# Patient Record
Sex: Male | Born: 1955 | Race: White | Hispanic: No | Marital: Married
Health system: Southern US, Community
[De-identification: ages and names within clinical notes are randomized; demographics above are authoritative.]

---

## 2010-04-27 ENCOUNTER — Encounter (INDEPENDENT_AMBULATORY_CARE_PROVIDER_SITE_OTHER): Payer: Self-pay | Admitting: General Surgery

## 2010-04-27 ENCOUNTER — Inpatient Hospital Stay (HOSPITAL_COMMUNITY): Admission: EM | Admit: 2010-04-27 | Discharge: 2010-04-28 | Payer: Self-pay | Admitting: Emergency Medicine

## 2011-01-16 ENCOUNTER — Encounter: Payer: Self-pay | Admitting: Family Medicine

## 2011-03-15 LAB — CBC
HCT: 44 % (ref 39.0–52.0)
Hemoglobin: 14.7 g/dL (ref 13.0–17.0)
MCHC: 33.4 g/dL (ref 30.0–36.0)
RDW: 12.7 % (ref 11.5–15.5)
WBC: 8.9 10*3/uL (ref 4.0–10.5)

## 2011-03-15 LAB — DIFFERENTIAL
Basophils Absolute: 0 10*3/uL (ref 0.0–0.1)
Eosinophils Relative: 0 % (ref 0–5)
Lymphocytes Relative: 8 % — ABNORMAL LOW (ref 12–46)
Monocytes Relative: 3 % (ref 3–12)

## 2011-03-15 LAB — COMPREHENSIVE METABOLIC PANEL
ALT: 21 U/L (ref 0–53)
CO2: 24 mEq/L (ref 19–32)
Calcium: 9.4 mg/dL (ref 8.4–10.5)
GFR calc Af Amer: >60 mL/min (ref >60)
Glucose, Bld: 188 mg/dL — ABNORMAL HIGH (ref 70–99)
Potassium: 4.6 mEq/L (ref 3.5–5.1)
Sodium: 140 mEq/L (ref 135–145)
Total Bilirubin: 0.6 mg/dL (ref 0.3–1.2)
Total Protein: 7.1 g/dL (ref 6.0–8.3)

## 2011-03-15 LAB — LIPASE, BLOOD: Lipase: 30 U/L (ref 11–59)

## 2011-04-15 IMAGING — CR DG CHEST 2V
2 series · 2 of 2 positions shown · non-contrast
Comparison: None.

CLINICAL DATA: Abdominal pain.  Pre laparoscopic cholecystectomy
evaluation.  Smoker.

CHEST - 2 VIEW

[w chest lat]
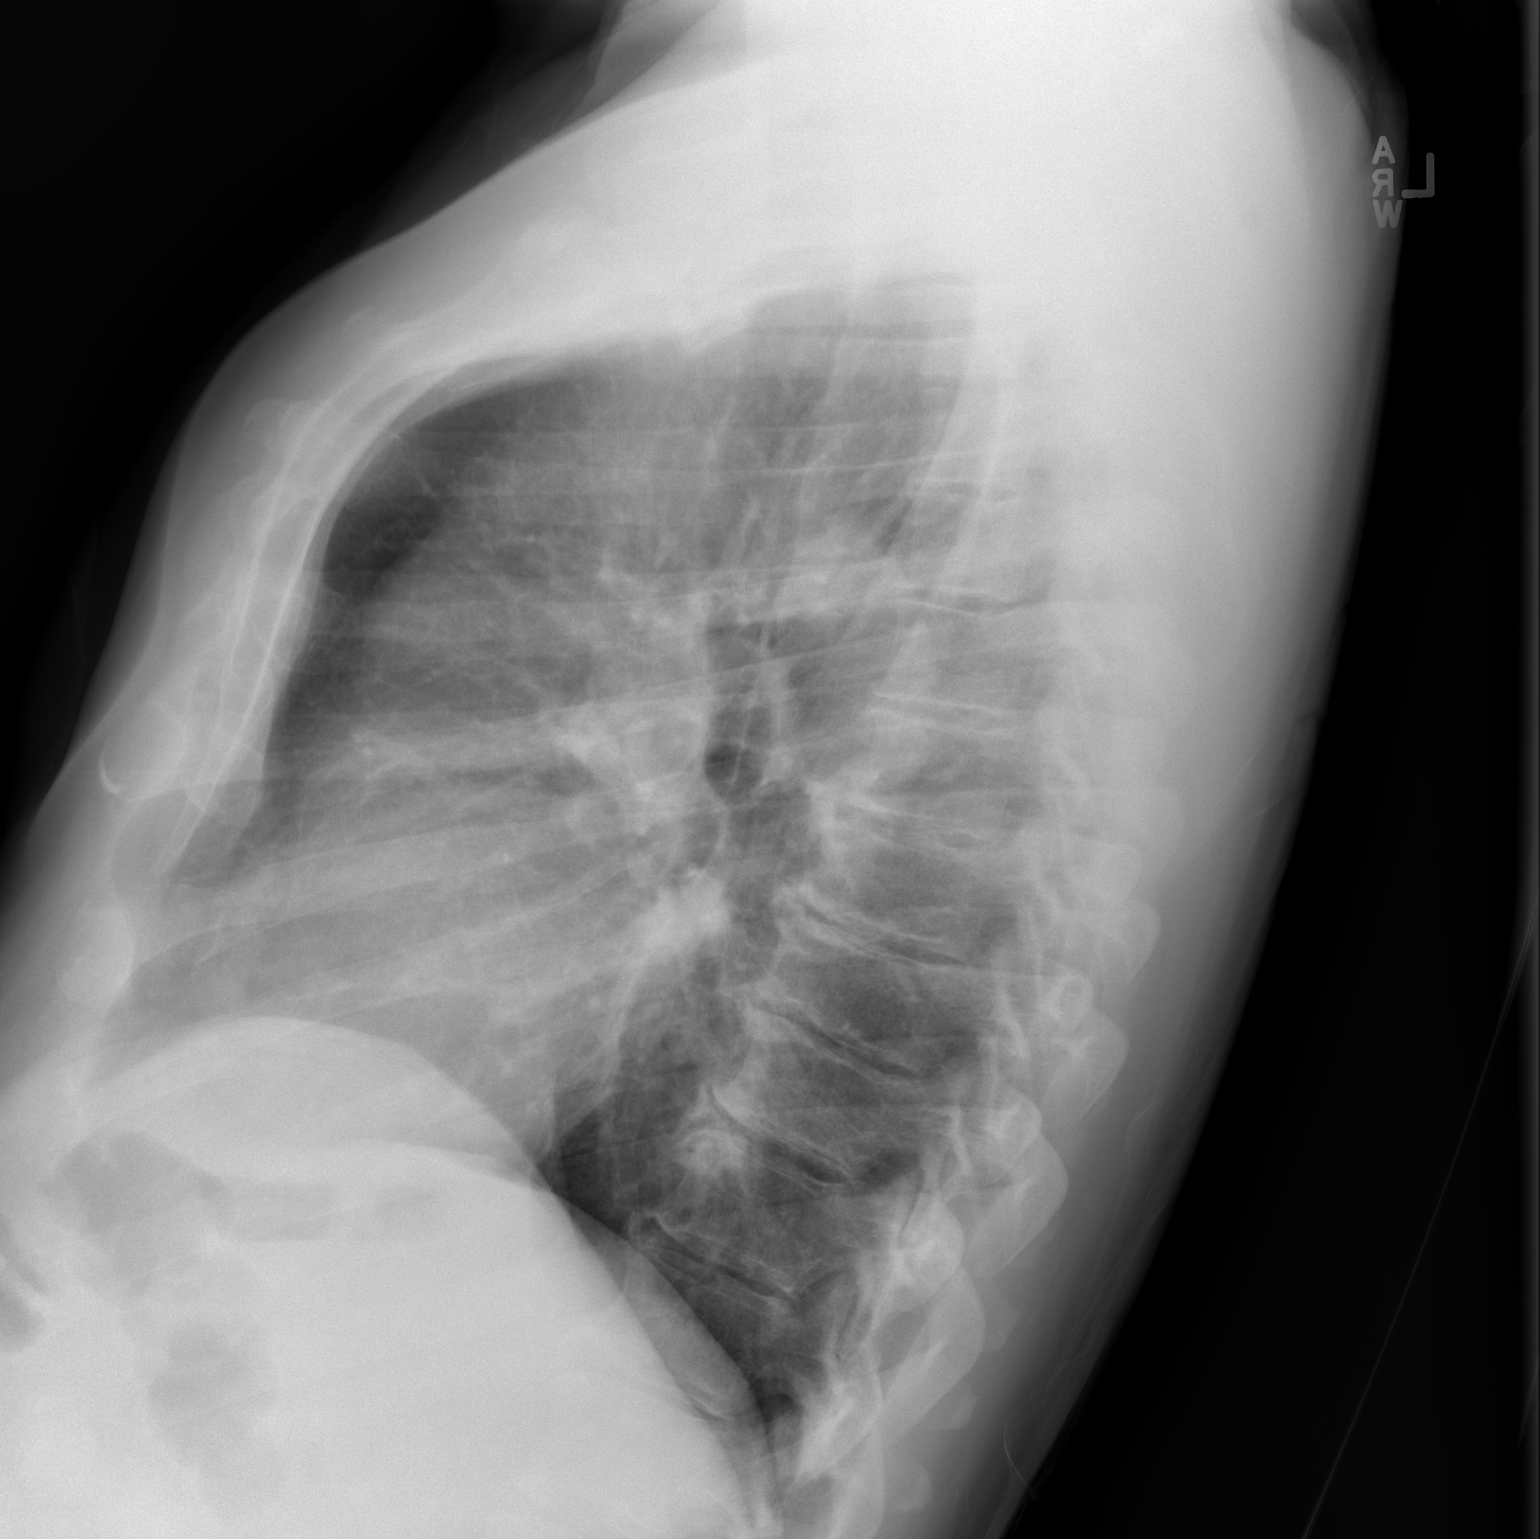

[view not recorded]
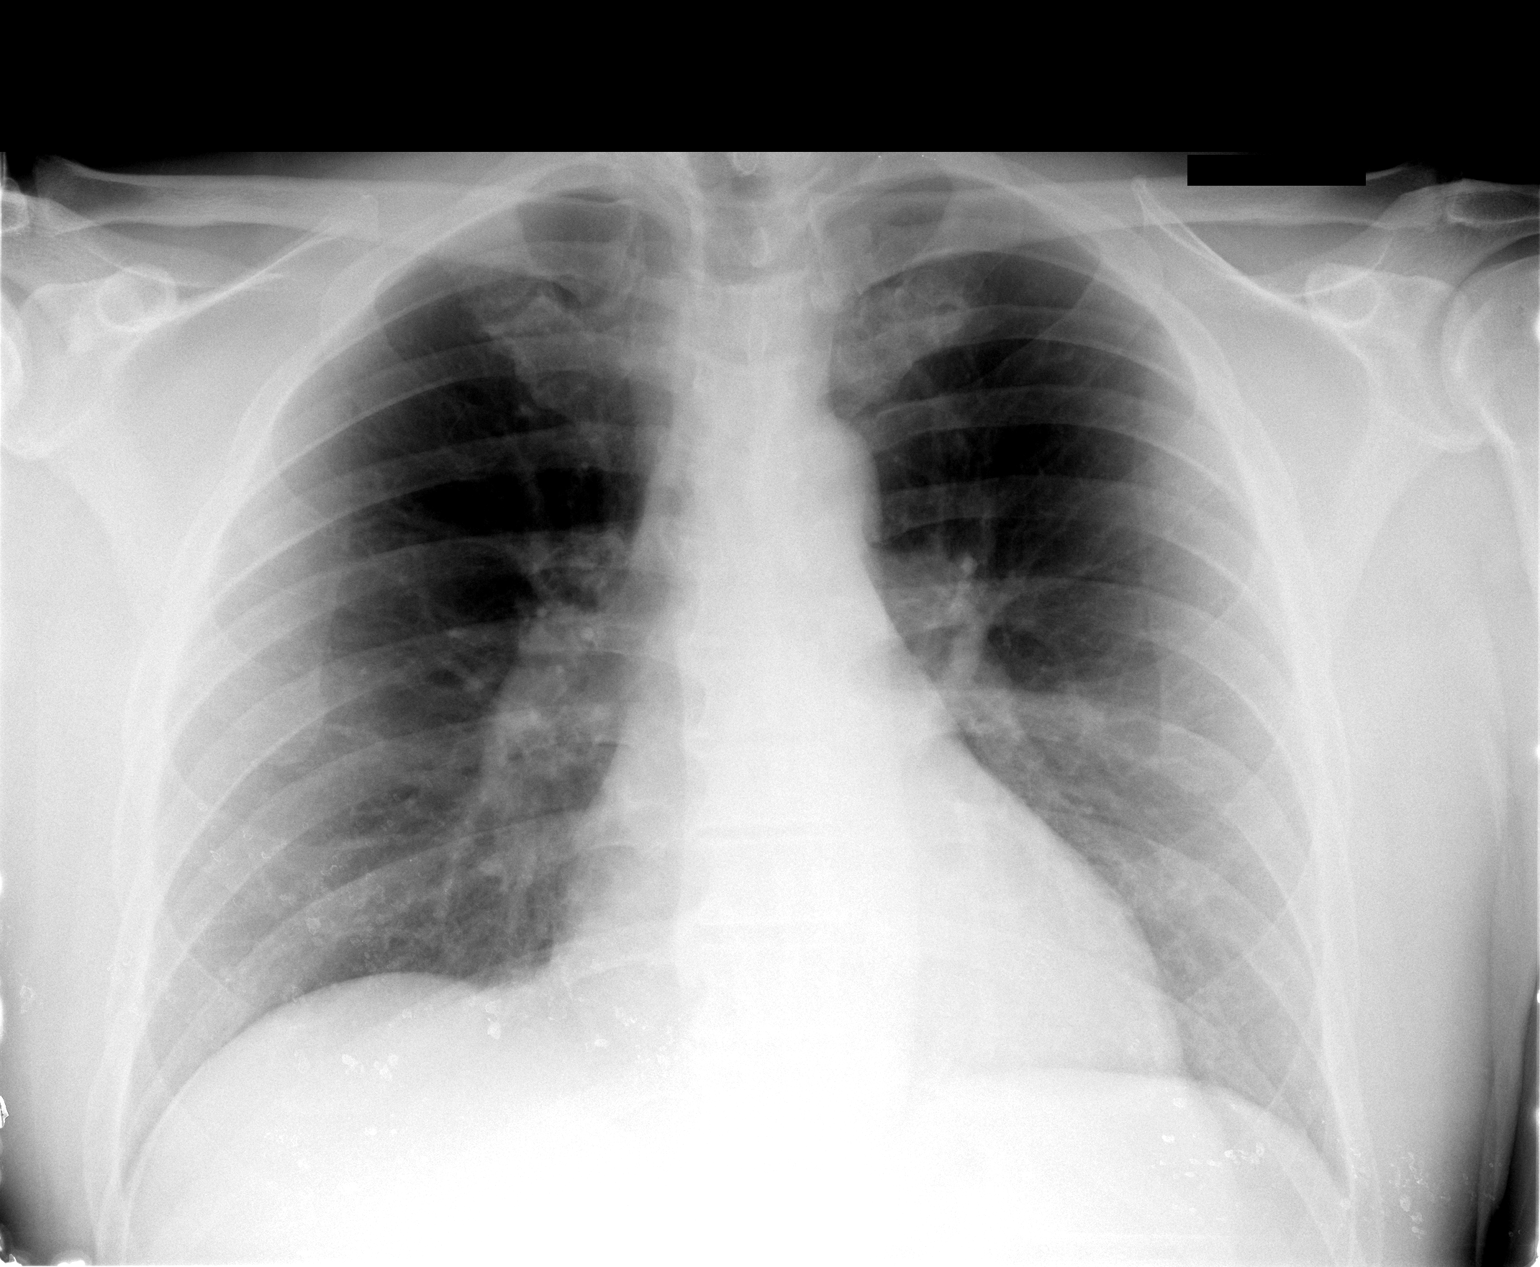

[2 of 2 positions shown; findings below may reference images not displayed]

FINDINGS: Borderline enlarged cardiac silhouette.  Mild prominence
of the central pulmonary vasculature.  Clear lungs.  Pectus
carinatum.  Thoracic spine degenerative changes.  Image artifacts
on the frontal view.
IMPRESSION: Borderline cardiomegaly and mild pulmonary vascular congestion.

## 2011-04-15 IMAGING — RF DG CHOLANGIOGRAM OPERATIVE
1 series · 4 of 4 positions shown · non-contrast
Comparison: Ultrasound obtained earlier today.

CLINICAL DATA: Laparoscopic cholecystectomy for cholelithiasis.

INTRAOPERATIVE CHOLANGIOGRAM
TECHNIQUE: Multiple fluoroscopic spot radiographs were obtained
during intraoperative cholangiogram and are submitted for
interpretation post-operatively.

[Series 1: run · 4 of 62 frames shown]
[frame 10/62]
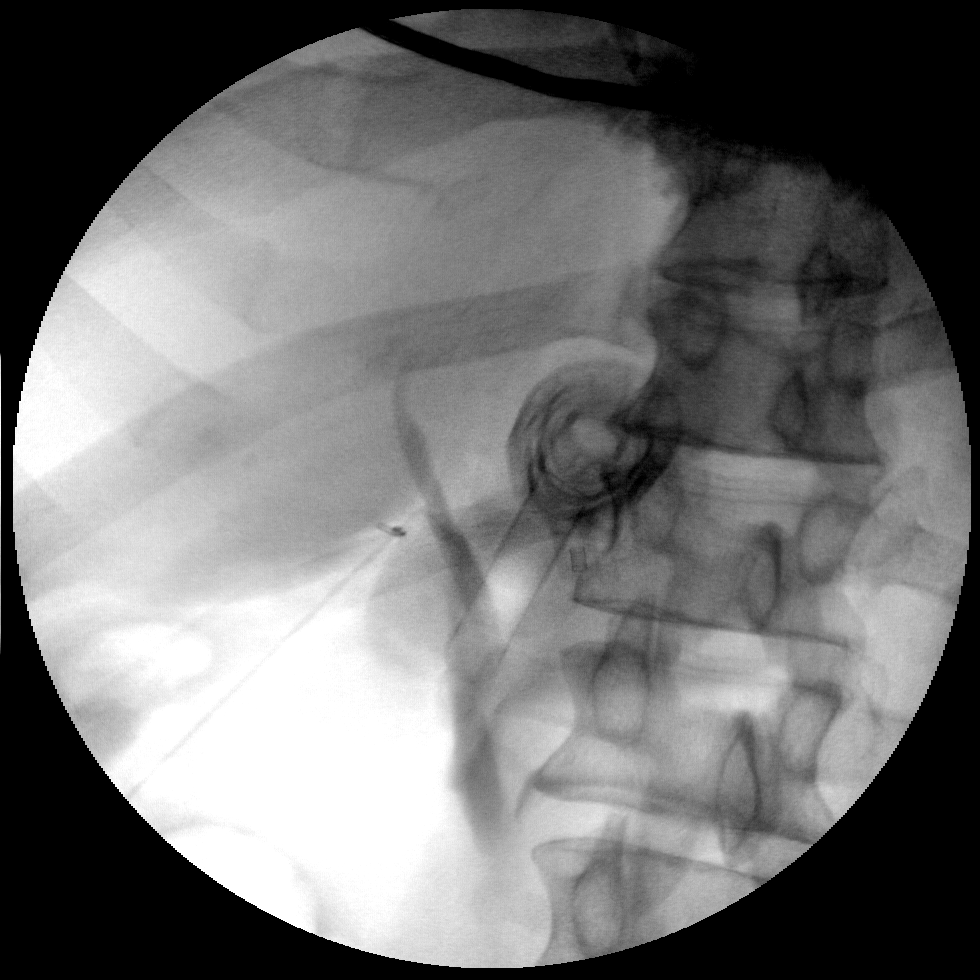
[frame 32/62]
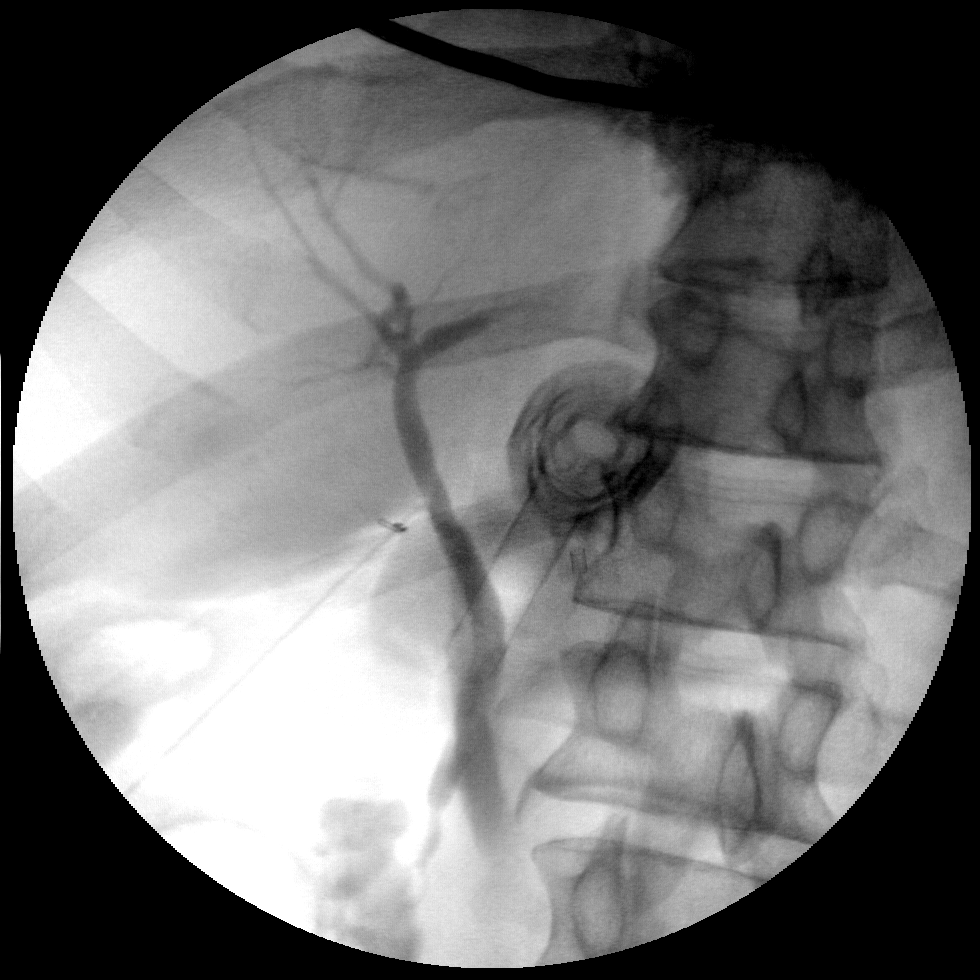
[frame 53/62]
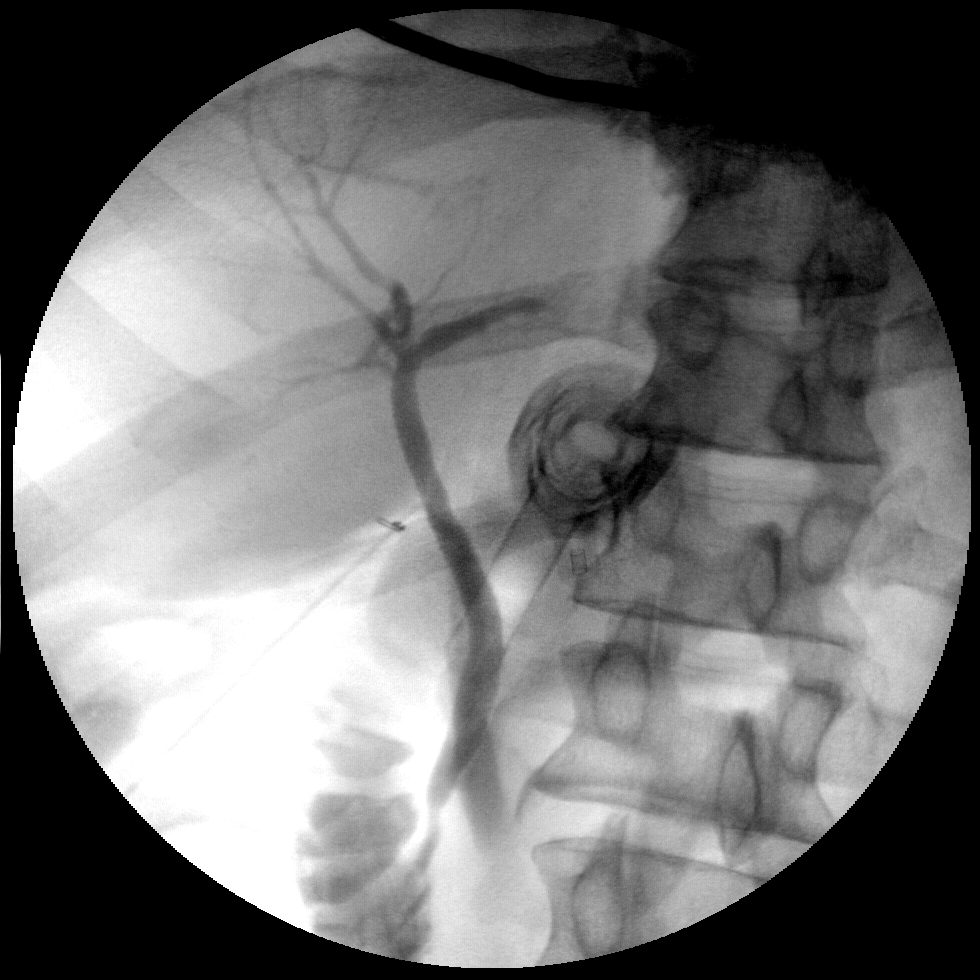
[frame 55/62]
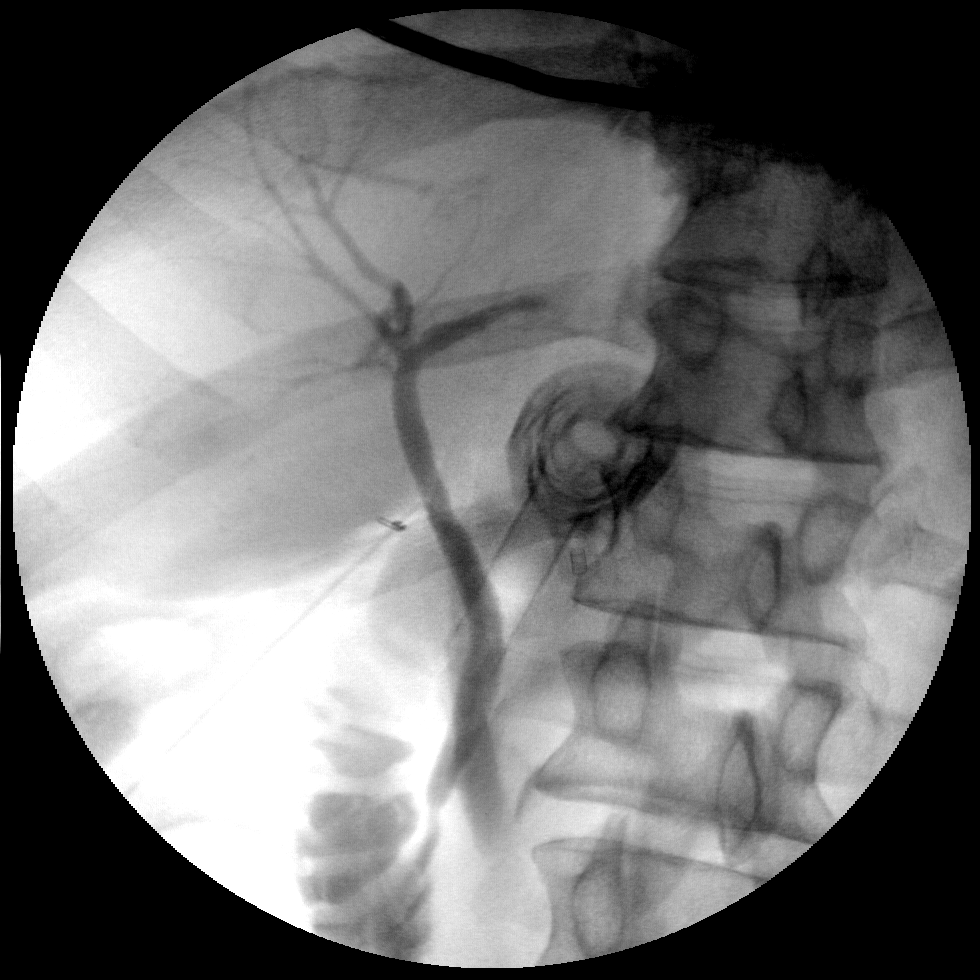

[4 of 4 positions shown; findings below may reference images not displayed]

FINDINGS: Normally opacified common duct and intrahepatic ducts
with free flow of contrast into the duodenum.  No ductal dilatation
or filling defects.
IMPRESSION: Normal examination.

These images were submitted for radiologic interpretation only.
Please see the procedural report for the amount of contrast and the
fluoroscopy time utilized.

## 2011-04-15 IMAGING — US US ABDOMEN COMPLETE
1 series · 14 of 25 positions shown · non-contrast
Comparison: None.

CLINICAL DATA: Abdominal pain.

COMPLETE ABDOMINAL ULTRASOUND

[Series 1: us abdomen complete · 0.30mm/px · 14 of 103 slices shown]
[im 1/103]
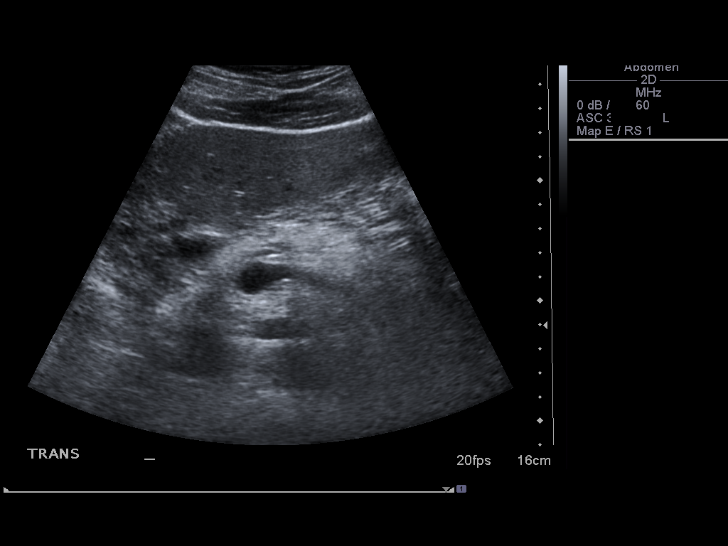
[im 9/103]
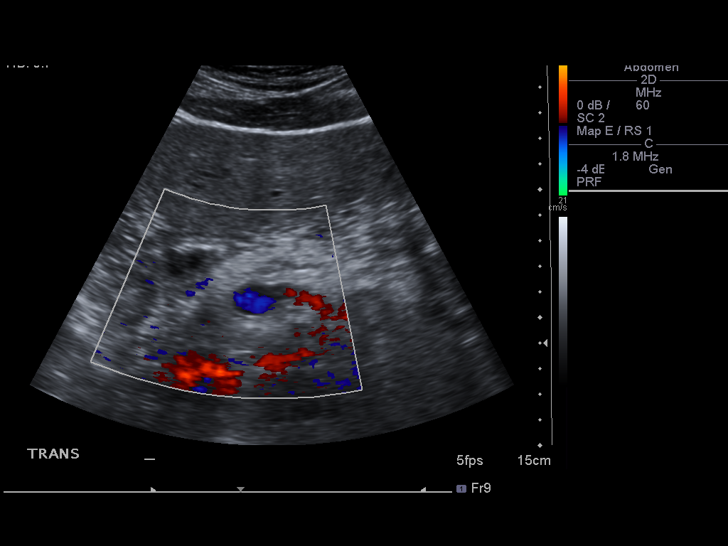
[im 18/103]
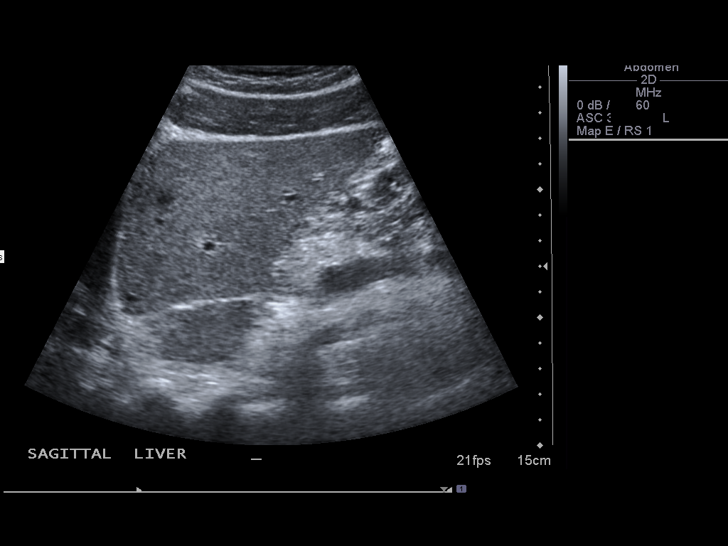
[im 26/103]
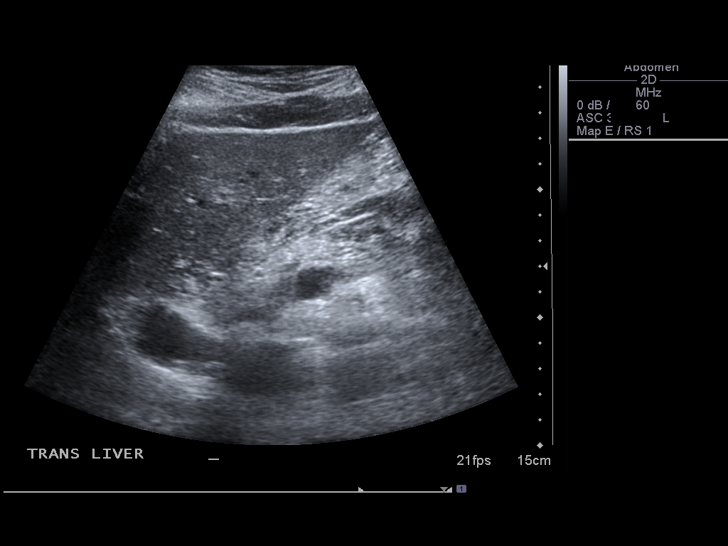
[im 35/103]
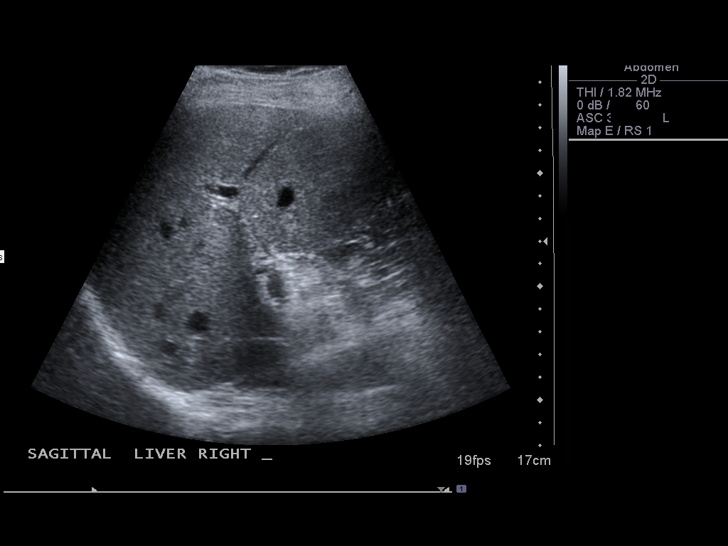
[im 39/103]
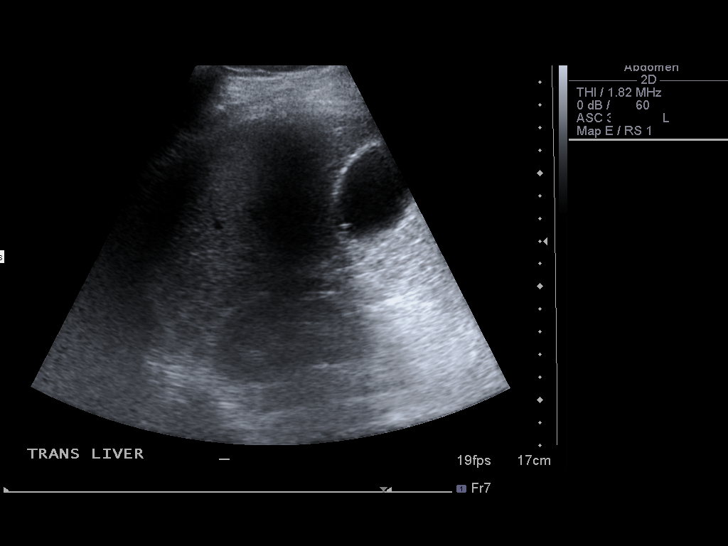
[im 47/103]
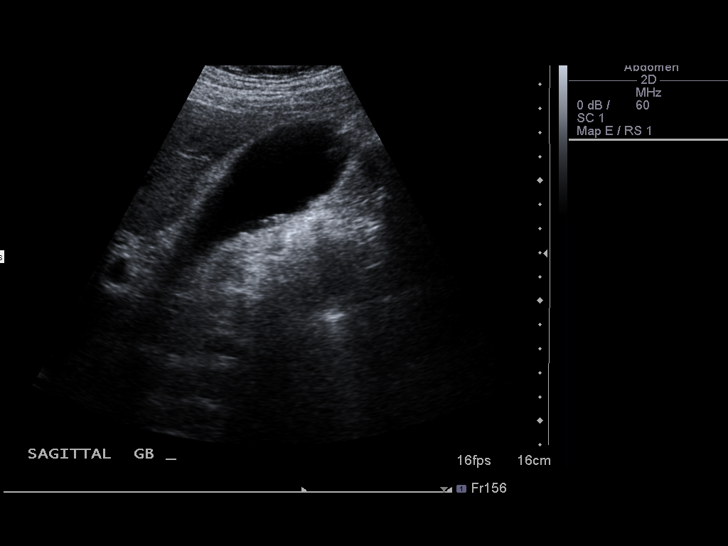
[im 56/103]
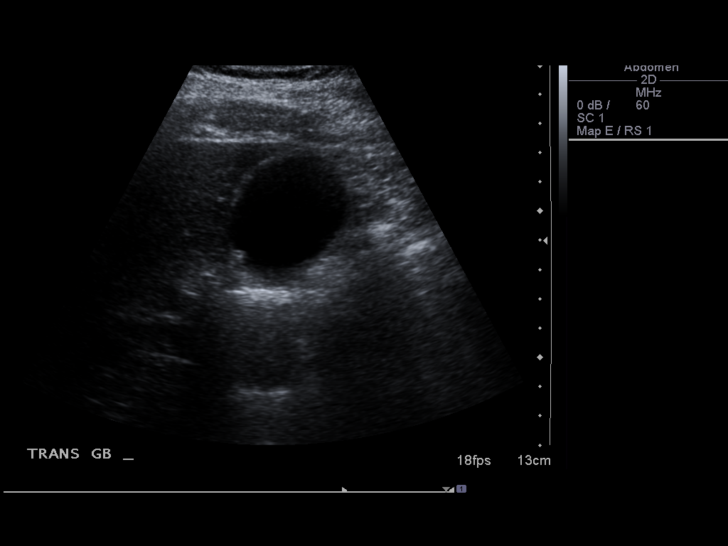
[im 64/103]
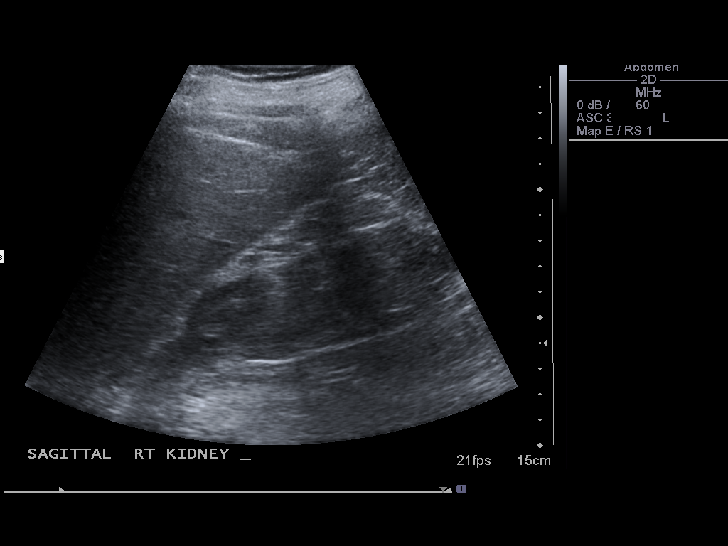
[im 69/103]
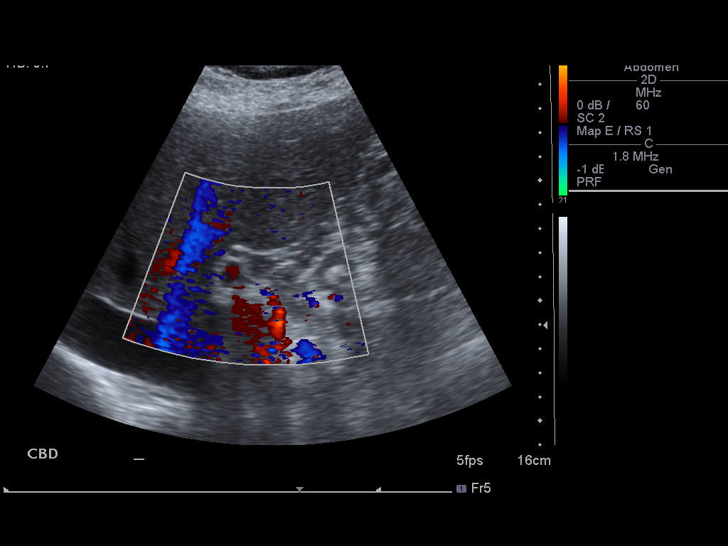
[im 77/103]
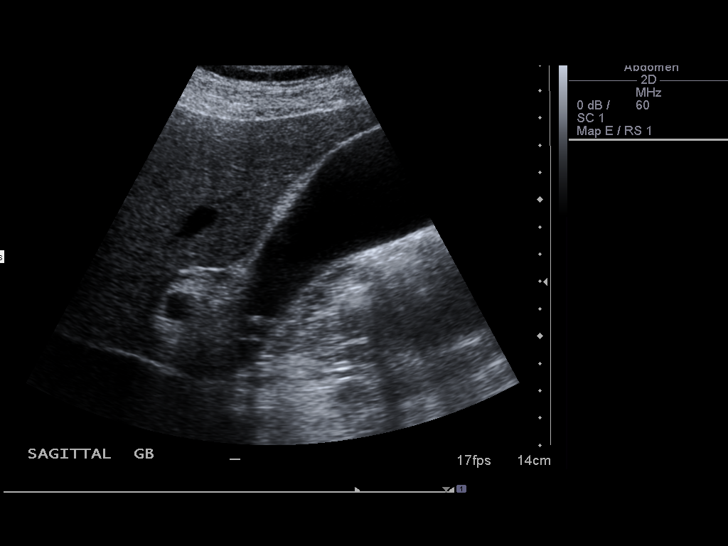
[im 86/103]
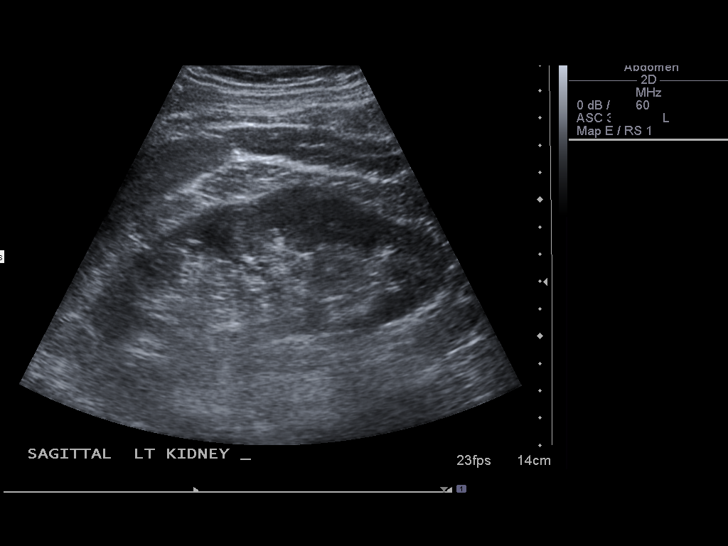
[im 94/103]
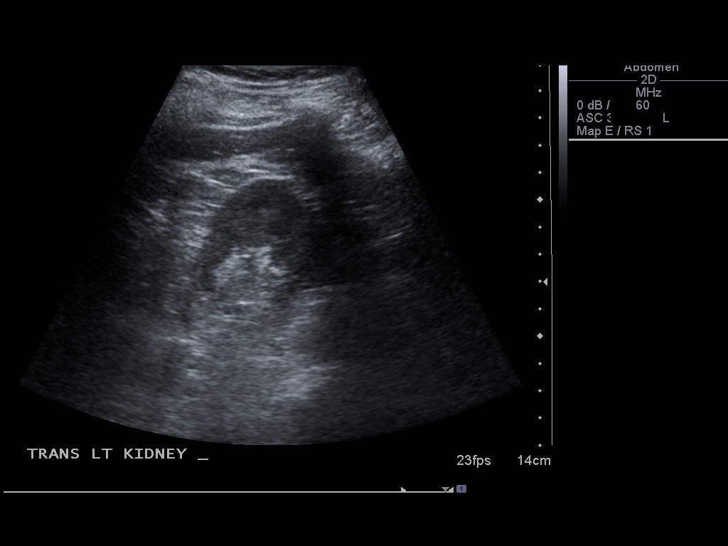
[im 103/103]
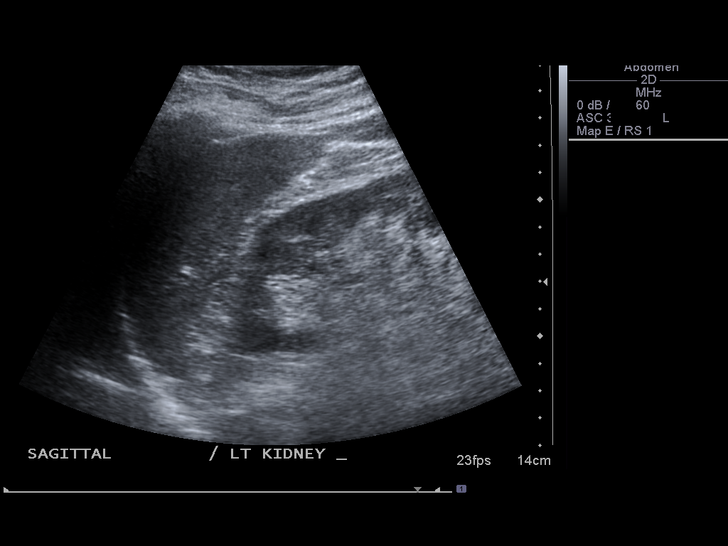

[14 of 25 positions shown; findings below may reference images not displayed]

FINDINGS: Gallbladder:  Multiple small, mobile gallstones in the gallbladder.
The largest stone measures 6 mm in maximum diameter.  The patient
was focally tender over the gallbladder despite pain medication.
No gallbladder wall thickening or pericholecystic fluid.

Common bile duct:  Normal, measuring 3.6 mm in diameter proximally.

Liver:  Normal.

IVC:  Normal.

Pancreas:  Minimally dilated pancreatic duct, measuring 2.5 mm in
maximum diameter.  Otherwise, normal appearing pancreas with no
visible mass.

Spleen:  Normal, measuring 9.0 cm in length.

Right Kidney:  Normal, measuring 13.7 cm in length.

Left Kidney:  Normal, measuring 14.0 cm in length.

Abdominal aorta:  Normal in caliber, without aneurysm.

No free peritoneal fluid seen.
IMPRESSION: 1.  Cholelithiasis and focal tenderness over the gallbladder.  No
other findings to indicate acute cholecystitis.
2.  Minimally dilated pancreatic duct, of doubtful clinical
significance.

## 2020-11-26 ENCOUNTER — Telehealth (HOSPITAL_COMMUNITY): Payer: Self-pay | Admitting: Family

## 2020-11-26 DIAGNOSIS — R69 Illness, unspecified: Secondary | ICD-10-CM

## 2020-11-26 NOTE — Telephone Encounter (Signed)
Called to discuss with Leonarda Salon about Covid symptoms and potential candidacy for the use of sotrovimab, a combination monoclonal antibody infusion for those with mild to moderate Covid symptoms and at a high risk of hospitalization.     Pt is qualified for this infusion at the infusion center due to co-morbid conditions and/or a member of an at-risk group, however unable to reach patient. VM left at the number provided in the spreadsheet. Patient was hotline referral.   Ronalda Walpole,NP

## 2022-12-07 DIAGNOSIS — R7303 Prediabetes: Secondary | ICD-10-CM | POA: Diagnosis not present

## 2022-12-07 DIAGNOSIS — Z0000 Encounter for general adult medical examination without abnormal findings: Secondary | ICD-10-CM | POA: Diagnosis not present

## 2022-12-07 DIAGNOSIS — Z13220 Encounter for screening for lipoid disorders: Secondary | ICD-10-CM | POA: Diagnosis not present

## 2022-12-07 DIAGNOSIS — Z125 Encounter for screening for malignant neoplasm of prostate: Secondary | ICD-10-CM | POA: Diagnosis not present

## 2022-12-12 DIAGNOSIS — Z0000 Encounter for general adult medical examination without abnormal findings: Secondary | ICD-10-CM | POA: Diagnosis not present

## 2022-12-12 DIAGNOSIS — R7303 Prediabetes: Secondary | ICD-10-CM | POA: Diagnosis not present

## 2022-12-12 DIAGNOSIS — M109 Gout, unspecified: Secondary | ICD-10-CM | POA: Diagnosis not present

## 2023-01-17 DIAGNOSIS — H2513 Age-related nuclear cataract, bilateral: Secondary | ICD-10-CM | POA: Diagnosis not present

## 2023-01-17 DIAGNOSIS — H40053 Ocular hypertension, bilateral: Secondary | ICD-10-CM | POA: Diagnosis not present

## 2023-01-17 DIAGNOSIS — H43392 Other vitreous opacities, left eye: Secondary | ICD-10-CM | POA: Diagnosis not present

## 2023-07-24 DIAGNOSIS — H40053 Ocular hypertension, bilateral: Secondary | ICD-10-CM | POA: Diagnosis not present

## 2023-08-21 DIAGNOSIS — L814 Other melanin hyperpigmentation: Secondary | ICD-10-CM | POA: Diagnosis not present

## 2023-08-21 DIAGNOSIS — C44612 Basal cell carcinoma of skin of right upper limb, including shoulder: Secondary | ICD-10-CM | POA: Diagnosis not present

## 2023-08-21 DIAGNOSIS — D485 Neoplasm of uncertain behavior of skin: Secondary | ICD-10-CM | POA: Diagnosis not present

## 2023-08-21 DIAGNOSIS — L578 Other skin changes due to chronic exposure to nonionizing radiation: Secondary | ICD-10-CM | POA: Diagnosis not present

## 2023-08-21 DIAGNOSIS — L821 Other seborrheic keratosis: Secondary | ICD-10-CM | POA: Diagnosis not present

## 2023-08-21 DIAGNOSIS — Z86018 Personal history of other benign neoplasm: Secondary | ICD-10-CM | POA: Diagnosis not present

## 2023-08-21 DIAGNOSIS — D225 Melanocytic nevi of trunk: Secondary | ICD-10-CM | POA: Diagnosis not present

## 2023-09-06 DIAGNOSIS — C44612 Basal cell carcinoma of skin of right upper limb, including shoulder: Secondary | ICD-10-CM | POA: Diagnosis not present

## 2023-10-12 DIAGNOSIS — C44612 Basal cell carcinoma of skin of right upper limb, including shoulder: Secondary | ICD-10-CM | POA: Diagnosis not present

## 2024-04-11 DIAGNOSIS — E785 Hyperlipidemia, unspecified: Secondary | ICD-10-CM | POA: Diagnosis not present

## 2024-04-11 DIAGNOSIS — E119 Type 2 diabetes mellitus without complications: Secondary | ICD-10-CM | POA: Diagnosis not present

## 2024-05-29 DIAGNOSIS — M25521 Pain in right elbow: Secondary | ICD-10-CM | POA: Diagnosis not present

## 2024-05-29 DIAGNOSIS — E119 Type 2 diabetes mellitus without complications: Secondary | ICD-10-CM | POA: Diagnosis not present

## 2024-05-29 DIAGNOSIS — I10 Essential (primary) hypertension: Secondary | ICD-10-CM | POA: Diagnosis not present

## 2024-06-19 DIAGNOSIS — S0501XA Injury of conjunctiva and corneal abrasion without foreign body, right eye, initial encounter: Secondary | ICD-10-CM | POA: Diagnosis not present

## 2024-06-20 DIAGNOSIS — H10011 Acute follicular conjunctivitis, right eye: Secondary | ICD-10-CM | POA: Diagnosis not present

## 2024-06-20 DIAGNOSIS — S0501XD Injury of conjunctiva and corneal abrasion without foreign body, right eye, subsequent encounter: Secondary | ICD-10-CM | POA: Diagnosis not present

## 2024-07-23 DIAGNOSIS — H43392 Other vitreous opacities, left eye: Secondary | ICD-10-CM | POA: Diagnosis not present

## 2024-07-23 DIAGNOSIS — H2513 Age-related nuclear cataract, bilateral: Secondary | ICD-10-CM | POA: Diagnosis not present

## 2024-07-23 DIAGNOSIS — L909 Atrophic disorder of skin, unspecified: Secondary | ICD-10-CM | POA: Diagnosis not present

## 2024-07-23 DIAGNOSIS — H40053 Ocular hypertension, bilateral: Secondary | ICD-10-CM | POA: Diagnosis not present

## 2024-09-06 DIAGNOSIS — D485 Neoplasm of uncertain behavior of skin: Secondary | ICD-10-CM | POA: Diagnosis not present

## 2024-09-06 DIAGNOSIS — L578 Other skin changes due to chronic exposure to nonionizing radiation: Secondary | ICD-10-CM | POA: Diagnosis not present

## 2024-09-06 DIAGNOSIS — D0462 Carcinoma in situ of skin of left upper limb, including shoulder: Secondary | ICD-10-CM | POA: Diagnosis not present

## 2024-09-06 DIAGNOSIS — L814 Other melanin hyperpigmentation: Secondary | ICD-10-CM | POA: Diagnosis not present

## 2024-09-06 DIAGNOSIS — D235 Other benign neoplasm of skin of trunk: Secondary | ICD-10-CM | POA: Diagnosis not present

## 2024-09-06 DIAGNOSIS — Z86018 Personal history of other benign neoplasm: Secondary | ICD-10-CM | POA: Diagnosis not present

## 2024-09-06 DIAGNOSIS — D225 Melanocytic nevi of trunk: Secondary | ICD-10-CM | POA: Diagnosis not present

## 2024-09-06 DIAGNOSIS — L820 Inflamed seborrheic keratosis: Secondary | ICD-10-CM | POA: Diagnosis not present

## 2024-09-06 DIAGNOSIS — L821 Other seborrheic keratosis: Secondary | ICD-10-CM | POA: Diagnosis not present

## 2024-09-19 DIAGNOSIS — C44629 Squamous cell carcinoma of skin of left upper limb, including shoulder: Secondary | ICD-10-CM | POA: Diagnosis not present

## 2024-09-19 DIAGNOSIS — L905 Scar conditions and fibrosis of skin: Secondary | ICD-10-CM | POA: Diagnosis not present
